# Patient Record
Sex: Female | Born: 1985 | Race: White | Hispanic: No | Marital: Married | Smoking: Current every day smoker
Health system: Southern US, Community
[De-identification: ages and names within clinical notes are randomized; demographics above are authoritative.]

---

## 2009-03-27 ENCOUNTER — Emergency Department (HOSPITAL_COMMUNITY): Admission: EM | Admit: 2009-03-27 | Discharge: 2009-03-28 | Payer: Self-pay | Admitting: Emergency Medicine

## 2009-11-16 IMAGING — CR DG CHEST 2V
2 series · 2 of 2 positions shown · non-contrast
Comparison: None

CLINICAL DATA: Shortness of breath, flank pain

CHEST - 2 VIEW

[w chest pa]
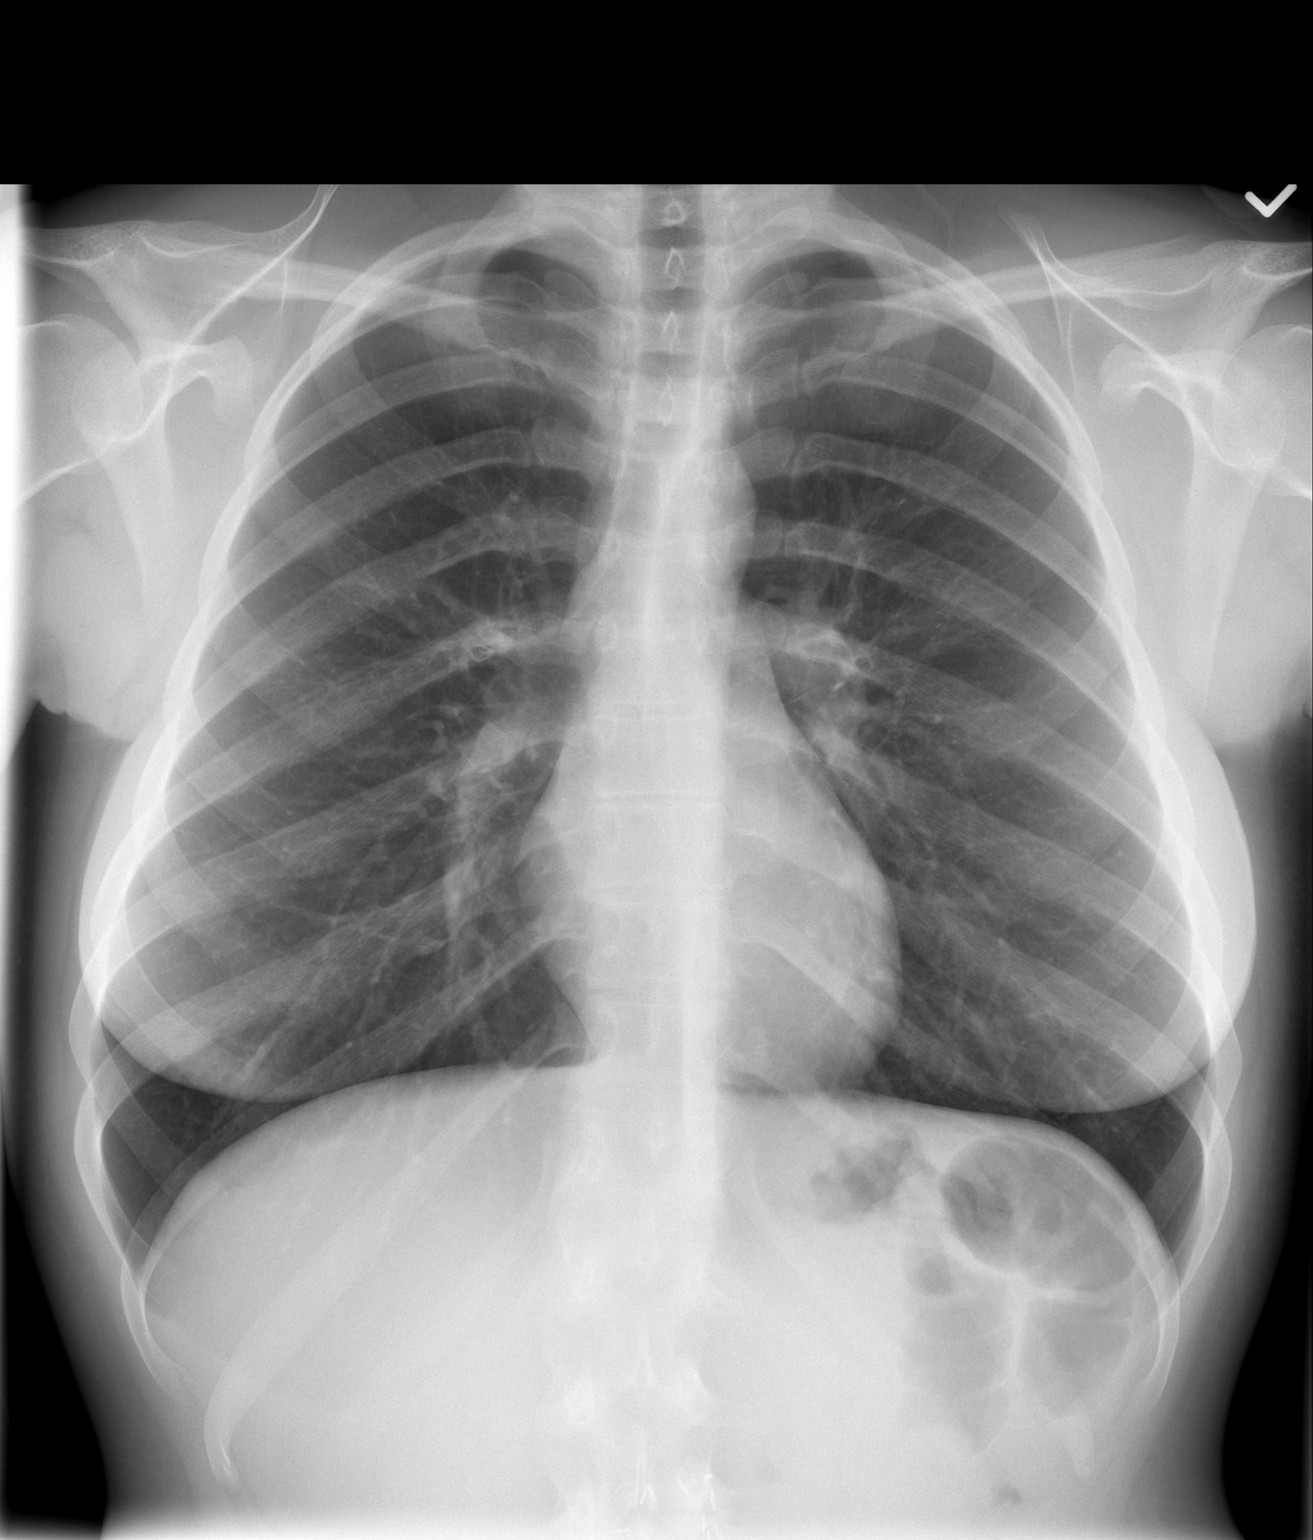

[w chest lat]
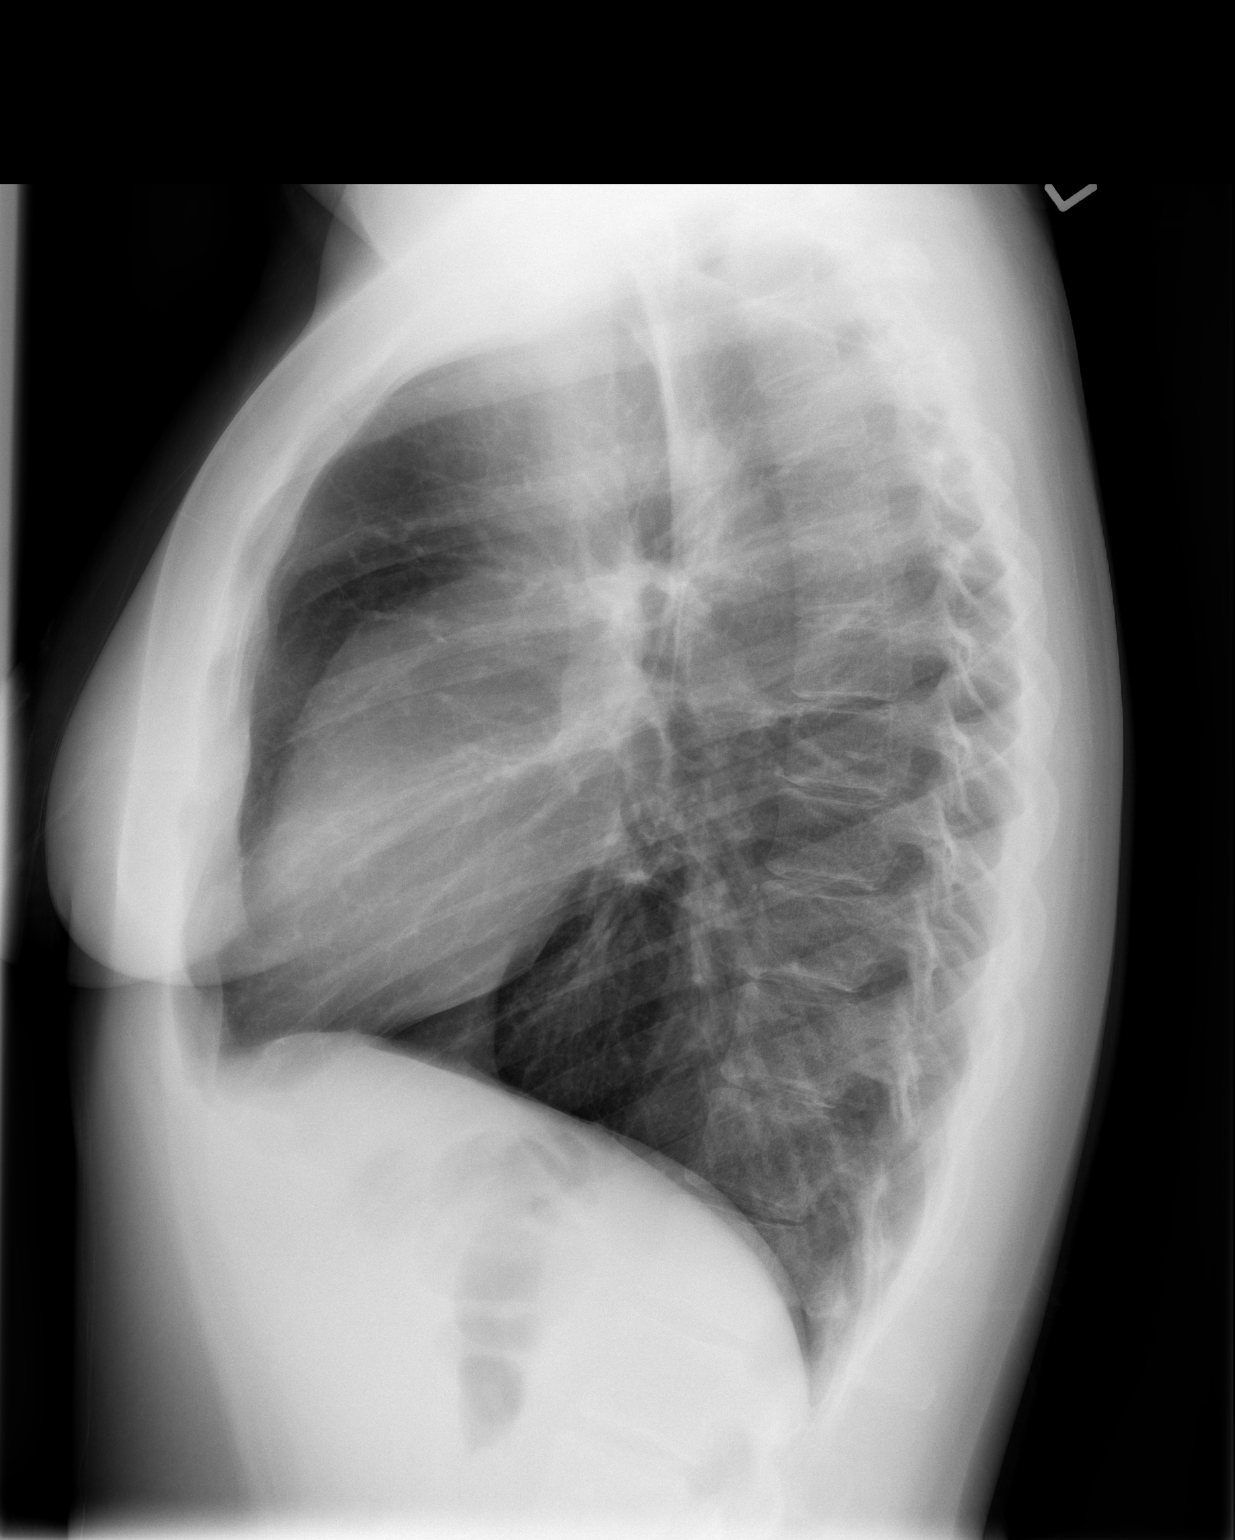

[2 of 2 positions shown; findings below may reference images not displayed]

FINDINGS: Heart and mediastinal contours are within normal limits.
No focal opacities or effusions.  No acute bony abnormality.
IMPRESSION: No active disease.

## 2011-04-05 LAB — URINALYSIS, ROUTINE W REFLEX MICROSCOPIC
Bilirubin Urine: NEGATIVE
Glucose, UA: NEGATIVE mg/dL
Hgb urine dipstick: NEGATIVE
Protein, ur: NEGATIVE mg/dL

## 2011-04-05 LAB — POCT PREGNANCY, URINE: Preg Test, Ur: NEGATIVE

## 2011-04-05 LAB — CBC
Hemoglobin: 12.9 g/dL (ref 12.0–15.0)
Platelets: 199 10*3/uL (ref 150–400)
RDW: 13.7 % (ref 11.5–15.5)
WBC: 7.2 10*3/uL (ref 4.0–10.5)

## 2011-04-05 LAB — HEPATIC FUNCTION PANEL
ALT: 15 U/L (ref 0–35)
AST: 16 U/L (ref 0–37)
Albumin: 3.8 g/dL (ref 3.5–5.2)
Total Protein: 6.5 g/dL (ref 6.0–8.3)

## 2011-04-05 LAB — D-DIMER, QUANTITATIVE: D-Dimer, Quant: 0.26 ug/mL-FEU (ref 0.00–0.48)

## 2011-04-05 LAB — POCT I-STAT, CHEM 8
BUN: 21 mg/dL (ref 6–23)
Chloride: 110 mEq/L (ref 96–112)
Sodium: 141 mEq/L (ref 135–145)

## 2011-04-05 LAB — DIFFERENTIAL
Basophils Absolute: 0 10*3/uL (ref 0.0–0.1)
Lymphocytes Relative: 23 % (ref 12–46)
Monocytes Absolute: 0.7 10*3/uL (ref 0.1–1.0)
Neutro Abs: 4.8 10*3/uL (ref 1.7–7.7)

## 2011-04-05 LAB — URINE MICROSCOPIC-ADD ON

## 2019-06-30 ENCOUNTER — Ambulatory Visit: Payer: Self-pay | Admitting: Pediatrics

## 2019-07-03 ENCOUNTER — Ambulatory Visit: Payer: Self-pay | Admitting: Allergy and Immunology

## 2022-08-30 ENCOUNTER — Telehealth: Payer: Medicaid Other | Admitting: Physician Assistant

## 2022-08-30 DIAGNOSIS — J019 Acute sinusitis, unspecified: Secondary | ICD-10-CM

## 2022-08-30 DIAGNOSIS — J208 Acute bronchitis due to other specified organisms: Secondary | ICD-10-CM | POA: Diagnosis not present

## 2022-08-30 DIAGNOSIS — B9689 Other specified bacterial agents as the cause of diseases classified elsewhere: Secondary | ICD-10-CM

## 2022-08-30 MED ORDER — AZITHROMYCIN 250 MG PO TABS: ORAL_TABLET | ORAL | 0 refills | Status: AC

## 2022-08-30 MED ORDER — ALBUTEROL SULFATE HFA 108 (90 BASE) MCG/ACT IN AERS: 2.0000 | INHALATION_SPRAY | Freq: Four times a day (QID) | RESPIRATORY_TRACT | 0 refills | Status: DC | PRN

## 2022-08-30 MED ORDER — PREDNISONE 20 MG PO TABS: 40.0000 mg | ORAL_TABLET | Freq: Every day | ORAL | 0 refills | Status: DC

## 2022-08-30 MED ORDER — BENZONATATE 100 MG PO CAPS: 100.0000 mg | ORAL_CAPSULE | Freq: Three times a day (TID) | ORAL | 0 refills | Status: DC | PRN

## 2022-08-30 NOTE — Progress Notes (Signed)
Virtual Visit Consent   Kari Barajas, you are scheduled for a virtual visit with a [facility] provider today. Just as with appointments in the office, your consent must be obtained to participate. Your consent will be active for this visit and any virtual visit you may have with one of our providers in the next 365 days. If you have a MyChart account, a copy of this consent can be sent to you electronically.  As this is a virtual visit, video technology does not allow for your provider to perform a traditional examination. This may limit your provider's ability to fully assess your condition. If your provider identifies any concerns that need to be evaluated in person or the need to arrange testing (such as labs, EKG, etc.), we will make arrangements to do so. Although advances in technology are sophisticated, we cannot ensure that it will always work on either your end or our end. If the connection with a video visit is poor, the visit may have to be switched to a telephone visit. With either a video or telephone visit, we are not always able to ensure that we have a secure connection.  By engaging in this virtual visit, you consent to the provision of healthcare and authorize for your insurance to be billed (if applicable) for the services provided during this visit. Depending on your insurance coverage, you may receive a charge related to this service.  I need to obtain your verbal consent now. Are you willing to proceed with your visit today? Kari Barajas has provided verbal consent on 08/30/2022 for a virtual visit (video or telephone). Margaretann Loveless, PA-C  Date: 08/30/2022 10:23 AM  Virtual Visit via Video Note   I, Margaretann Loveless, connected with  Kari Barajas  ([id number], 05-12-1986) on 08/30/22 at 10:15 AM EDT by a video-enabled telemedicine application and verified that I am speaking with the correct person using two identifiers.  Location: Patient: Virtual Visit Location  Patient: Home Provider: Virtual Visit Location Provider: Home Office   I discussed the limitations of evaluation and management by telemedicine and the availability of in person appointments. The patient expressed understanding and agreed to proceed.    History of Present Illness: Kari Barajas is a 36 y.o. who identifies as a female who was assigned female at birth, and is being seen today for cough.  HPI: Cough This is a new problem. The current episode started 1 to 4 weeks ago. The problem has been gradually worsening. The problem occurs every few minutes. The cough is Productive of sputum and productive of purulent sputum. Associated symptoms include chest pain (congestion), ear congestion, headaches, nasal congestion, postnasal drip, rhinorrhea, a sore throat (mild), shortness of breath and wheezing (mild). Pertinent negatives include no chills, ear pain or fever. The symptoms are aggravated by lying down. She has tried a beta-agonist inhaler and OTC cough suppressant for the symptoms. The treatment provided no relief. Her past medical history is significant for bronchitis.      Problems: There are no problems to display for this patient.   Allergies: Not on File Medications:  Current Outpatient Medications:    albuterol (VENTOLIN HFA) 108 (90 Base) MCG/ACT inhaler, Inhale 2 puffs into the lungs every 6 (six) hours as needed for wheezing or shortness of breath., Disp: 18 g, Rfl: 0   azithromycin (ZITHROMAX) 250 MG tablet, Take 2 tablets on day 1, then 1 tablet daily on days 2 through 5, Disp: 6 tablet, Rfl: 0   benzonatate (  TESSALON) 100 MG capsule, Take 1 capsule (100 mg total) by mouth 3 (three) times daily as needed., Disp: 30 capsule, Rfl: 0   predniSONE (DELTASONE) 20 MG tablet, Take 2 tablets (40 mg total) by mouth daily with breakfast., Disp: 10 tablet, Rfl: 0  Observations/Objective: Patient is well-developed, well-nourished in no acute distress.  Resting comfortably at home.   Head is normocephalic, atraumatic.  No labored breathing.  Speech is clear and coherent with logical content.  Patient is alert and oriented at baseline.    Assessment and Plan: 1. Acute bacterial bronchitis - azithromycin (ZITHROMAX) 250 MG tablet; Take 2 tablets on day 1, then 1 tablet daily on days 2 through 5  Dispense: 6 tablet; Refill: 0 - albuterol (VENTOLIN HFA) 108 (90 Base) MCG/ACT inhaler; Inhale 2 puffs into the lungs every 6 (six) hours as needed for wheezing or shortness of breath.  Dispense: 18 g; Refill: 0 - benzonatate (TESSALON) 100 MG capsule; Take 1 capsule (100 mg total) by mouth 3 (three) times daily as needed.  Dispense: 30 capsule; Refill: 0  2. Acute bacterial sinusitis - azithromycin (ZITHROMAX) 250 MG tablet; Take 2 tablets on day 1, then 1 tablet daily on days 2 through 5  Dispense: 6 tablet; Refill: 0 - predniSONE (DELTASONE) 20 MG tablet; Take 2 tablets (40 mg total) by mouth daily with breakfast.  Dispense: 10 tablet; Refill: 0  - Worsening over a week despite OTC medications - Will treat with Z-pack, prednisone, albuterol, and tessalon perles - Can continue Mucinex  - Push fluids.  - Rest.  - Steam and humidifier can help - Seek in person evaluation if worsening or symptoms fail to improve    Follow Up Instructions: I discussed the assessment and treatment plan with the patient. The patient was provided an opportunity to ask questions and all were answered. The patient agreed with the plan and demonstrated an understanding of the instructions.  A copy of instructions were sent to the patient via MyChart unless otherwise noted below.    The patient was advised to call back or seek an in-person evaluation if the symptoms worsen or if the condition fails to improve as anticipated.  Time:  I spent 10 minutes with the patient via telehealth technology discussing the above problems/concerns.    Margaretann Loveless, PA-C

## 2022-08-30 NOTE — Patient Instructions (Signed)
Aviva Kluver, thank you for joining Margaretann Loveless, PA-C for today's virtual visit.  While this provider is not your primary care provider (PCP), if your PCP is located in our provider database this encounter information will be shared with them immediately following your visit.  Consent: (Patient) Kari Barajas provided verbal consent for this virtual visit at the beginning of the encounter.  Current Medications:  Current Outpatient Medications:    albuterol (VENTOLIN HFA) 108 (90 Base) MCG/ACT inhaler, Inhale 2 puffs into the lungs every 6 (six) hours as needed for wheezing or shortness of breath., Disp: 18 g, Rfl: 0   azithromycin (ZITHROMAX) 250 MG tablet, Take 2 tablets on day 1, then 1 tablet daily on days 2 through 5, Disp: 6 tablet, Rfl: 0   benzonatate (TESSALON) 100 MG capsule, Take 1 capsule (100 mg total) by mouth 3 (three) times daily as needed., Disp: 30 capsule, Rfl: 0   predniSONE (DELTASONE) 20 MG tablet, Take 2 tablets (40 mg total) by mouth daily with breakfast., Disp: 10 tablet, Rfl: 0   Medications ordered in this encounter:  Meds ordered this encounter  Medications   azithromycin (ZITHROMAX) 250 MG tablet    Sig: Take 2 tablets on day 1, then 1 tablet daily on days 2 through 5    Dispense:  6 tablet    Refill:  0    Order Specific Question:   Supervising Provider    Answer:   Hyacinth Meeker, BRIAN [[id number]]   albuterol (VENTOLIN HFA) 108 (90 Base) MCG/ACT inhaler    Sig: Inhale 2 puffs into the lungs every 6 (six) hours as needed for wheezing or shortness of breath.    Dispense:  18 g    Refill:  0    Order Specific Question:   Supervising Provider    Answer:   MILLER, BRIAN [[id number]]   predniSONE (DELTASONE) 20 MG tablet    Sig: Take 2 tablets (40 mg total) by mouth daily with breakfast.    Dispense:  10 tablet    Refill:  0    Order Specific Question:   Supervising Provider    Answer:   MILLER, BRIAN [[id number]]   benzonatate (TESSALON) 100 MG capsule    Sig: Take 1  capsule (100 mg total) by mouth 3 (three) times daily as needed.    Dispense:  30 capsule    Refill:  0    Order Specific Question:   Supervising Provider    Answer:   Hyacinth Meeker, BRIAN [[id number]]     *If you need refills on other medications prior to your next appointment, please contact your pharmacy*  Follow-Up: Call back or seek an in-person evaluation if the symptoms worsen or if the condition fails to improve as anticipated.  Other Instructions Acute Bronchitis, Adult  Acute bronchitis is when air tubes in the lungs (bronchi) suddenly get swollen. The condition can make it hard for you to breathe. In adults, acute bronchitis usually goes away within 2 weeks. A cough caused by bronchitis may last up to 3 weeks. Smoking, allergies, and asthma can make the condition worse. What are the causes? Germs that cause cold and flu (viruses). The most common cause of this condition is the virus that causes the common cold. Bacteria. Substances that bother (irritate) the lungs, including: Smoke from cigarettes and other types of tobacco. Dust and pollen. Fumes from chemicals, gases, or burned fuel. Indoor or outdoor air pollution. What increases the risk? A weak body's defense system. This is also  called the immune system. Any condition that affects your lungs and breathing, such as asthma. What are the signs or symptoms? A cough. Coughing up clear, yellow, or green mucus. Making high-pitched whistling sounds when you breathe, most often when you breathe out (wheezing). Runny or stuffy nose. Having too much mucus in your lungs (chest congestion). Shortness of breath. Body aches. A sore throat. How is this treated? Acute bronchitis may go away over time without treatment. Your doctor may tell you to: Drink more fluids. This will help thin your mucus so it is easier to cough up. Use a device that gets medicine into your lungs (inhaler). Use a vaporizer or a humidifier. These are machines that  add water to the air. This helps with coughing and poor breathing. Take a medicine that thins mucus and helps clear it from your lungs. Take a medicine that prevents or stops coughing. It is not common to take an antibiotic medicine for this condition. Follow these instructions at home:  Take over-the-counter and prescription medicines only as told by your doctor. Use an inhaler, vaporizer, or humidifier as told by your doctor. Take two teaspoons (10 mL) of honey at bedtime. This helps lessen your coughing at night. Drink enough fluid to keep your pee (urine) pale yellow. Do not smoke or use any products that contain nicotine or tobacco. If you need help quitting, ask your doctor. Get a lot of rest. Return to your normal activities when your doctor says that it is safe. Keep all follow-up visits. How is this prevented?  Wash your hands often with soap and water for at least 20 seconds. If you cannot use soap and water, use hand sanitizer. Avoid contact with people who have cold symptoms. Try not to touch your mouth, nose, or eyes with your hands. Avoid breathing in smoke or chemical fumes. Make sure to get the flu shot every year. Contact a doctor if: Your symptoms do not get better in 2 weeks. You have trouble coughing up the mucus. Your cough keeps you awake at night. You have a fever. Get help right away if: You cough up blood. You have chest pain. You have very bad shortness of breath. You faint or keep feeling like you are going to faint. You have a very bad headache. Your fever or chills get worse. These symptoms may be an emergency. Get help right away. Call your local emergency services (911 in the [place] Do not wait to see if the symptoms will go away. Do not drive yourself to the hospital. Summary Acute bronchitis is when air tubes in the lungs (bronchi) suddenly get swollen. In adults, acute bronchitis usually goes away within 2 weeks. Drink more fluids. This will  help thin your mucus so it is easier to cough up. Take over-the-counter and prescription medicines only as told by your doctor. Contact a doctor if your symptoms do not improve after 2 weeks of treatment. This information is not intended to replace advice given to you by your health care provider. Make sure you discuss any questions you have with your health care provider. Document Revised: 04/13/2021 Document Reviewed: 04/13/2021 Elsevier Patient Education  2023 Elsevier Inc.    If you have been instructed to have an in-person evaluation today at a local Urgent Care facility, please use the link below. It will take you to a list of all of our available [facility], including address, phone number and hours of operation. Please do not delay care.  [facility]  If you or a family member do not have a primary care provider, use the link below to schedule a visit and establish care. When you choose a [facility] primary care physician or advanced practice provider, you gain a long-term partner in health. Find a Primary Care Provider  Learn more about [facility]'s in-office and virtual care options: [facility]

## 2022-09-06 ENCOUNTER — Telehealth: Payer: Medicaid Other | Admitting: Physician Assistant

## 2022-09-06 DIAGNOSIS — H6983 Other specified disorders of Eustachian tube, bilateral: Secondary | ICD-10-CM

## 2022-09-06 MED ORDER — IPRATROPIUM BROMIDE 0.03 % NA SOLN: 2.0000 | Freq: Two times a day (BID) | NASAL | 0 refills | Status: DC

## 2022-09-06 MED ORDER — FLUTICASONE PROPIONATE 50 MCG/ACT NA SUSP: 2.0000 | Freq: Every day | NASAL | 0 refills | Status: DC

## 2022-09-06 NOTE — Progress Notes (Signed)
Virtual Visit Consent   Kari Barajas, you are scheduled for a virtual visit with a [facility] provider today. Just as with appointments in the office, your consent must be obtained to participate. Your consent will be active for this visit and any virtual visit you may have with one of our providers in the next 365 days. If you have a MyChart account, a copy of this consent can be sent to you electronically.  As this is a virtual visit, video technology does not allow for your provider to perform a traditional examination. This may limit your provider's ability to fully assess your condition. If your provider identifies any concerns that need to be evaluated in person or the need to arrange testing (such as labs, EKG, etc.), we will make arrangements to do so. Although advances in technology are sophisticated, we cannot ensure that it will always work on either your end or our end. If the connection with a video visit is poor, the visit may have to be switched to a telephone visit. With either a video or telephone visit, we are not always able to ensure that we have a secure connection.  By engaging in this virtual visit, you consent to the provision of healthcare and authorize for your insurance to be billed (if applicable) for the services provided during this visit. Depending on your insurance coverage, you may receive a charge related to this service.  I need to obtain your verbal consent now. Are you willing to proceed with your visit today? Kari Barajas has provided verbal consent on 09/06/2022 for a virtual visit (video or telephone). Margaretann Loveless, PA-C  Date: 09/06/2022 1:08 PM  Virtual Visit via Video Note   I, Margaretann Loveless, connected with  Kari Barajas  ([id number], [birth year]) on 09/06/22 at 12:45 PM EDT by a video-enabled telemedicine application and verified that I am speaking with the correct person using two identifiers.  Location: Patient: Virtual Visit Location  Patient: Home Provider: Virtual Visit Location Provider: Home Office   I discussed the limitations of evaluation and management by telemedicine and the availability of in person appointments. The patient expressed understanding and agreed to proceed.    History of Present Illness: Kari Barajas is a 36 y.o. who identifies as a female who was assigned female at birth, and is being seen today for continued head congestion symptoms . Was seen virtually on 08/30/22 for bacterial bronchitis and sinusitis. Started on Zpack, prednisone 40mg  x 5 days and albuterol. Reports chest symptoms have improved some, but still having head congestion, aural fullness, feeling off balance ("like on a boat or on the moon"). Some purulent discharge still present.    Problems: There are no problems to display for this patient.   Allergies: Not on File Medications:  Current Outpatient Medications:    fluticasone (FLONASE) 50 MCG/ACT nasal spray, Place 2 sprays into both nostrils daily., Disp: 16 g, Rfl: 0   ipratropium (ATROVENT) 0.03 % nasal spray, Place 2 sprays into both nostrils every 12 (twelve) hours., Disp: 30 mL, Rfl: 0   albuterol (VENTOLIN HFA) 108 (90 Base) MCG/ACT inhaler, Inhale 2 puffs into the lungs every 6 (six) hours as needed for wheezing or shortness of breath., Disp: 18 g, Rfl: 0   benzonatate (TESSALON) 100 MG capsule, Take 1 capsule (100 mg total) by mouth 3 (three) times daily as needed., Disp: 30 capsule, Rfl: 0   predniSONE (DELTASONE) 20 MG tablet, Take 2 tablets (40 mg total) by mouth daily  with breakfast., Disp: 10 tablet, Rfl: 0  Observations/Objective: Patient is well-developed, well-nourished in no acute distress.  Resting comfortably at home.  Head is normocephalic, atraumatic.  No labored breathing.  Speech is clear and coherent with logical content.  Patient is alert and oriented at baseline.    Assessment and Plan: 1. ETD (Eustachian tube dysfunction), bilateral - ipratropium  (ATROVENT) 0.03 % nasal spray; Place 2 sprays into both nostrils every 12 (twelve) hours.  Dispense: 30 mL; Refill: 0 - fluticasone (FLONASE) 50 MCG/ACT nasal spray; Place 2 sprays into both nostrils daily.  Dispense: 16 g; Refill: 0  - Suspect ETD from inflammation - Add Fluticasone and Ipratropium bromide for congestion, inflammation and drainage - May use tylenol for pain - Advised she would need further evaluation in person if symptoms continue to worsen or fail to improve  Follow Up Instructions: I discussed the assessment and treatment plan with the patient. The patient was provided an opportunity to ask questions and all were answered. The patient agreed with the plan and demonstrated an understanding of the instructions.  A copy of instructions were sent to the patient via MyChart unless otherwise noted below.    The patient was advised to call back or seek an in-person evaluation if the symptoms worsen or if the condition fails to improve as anticipated.  Time:  I spent 12 minutes with the patient via telehealth technology discussing the above problems/concerns.    Margaretann Loveless, PA-C

## 2022-09-06 NOTE — Patient Instructions (Signed)
Aviva Kluver, thank you for joining Margaretann Loveless, PA-C for today's virtual visit.  While this provider is not your primary care provider (PCP), if your PCP is located in our provider database this encounter information will be shared with them immediately following your visit.  Consent: (Patient) Kari Barajas provided verbal consent for this virtual visit at the beginning of the encounter.  Current Medications:  Current Outpatient Medications:    fluticasone (FLONASE) 50 MCG/ACT nasal spray, Place 2 sprays into both nostrils daily., Disp: 16 g, Rfl: 0   ipratropium (ATROVENT) 0.03 % nasal spray, Place 2 sprays into both nostrils every 12 (twelve) hours., Disp: 30 mL, Rfl: 0   albuterol (VENTOLIN HFA) 108 (90 Base) MCG/ACT inhaler, Inhale 2 puffs into the lungs every 6 (six) hours as needed for wheezing or shortness of breath., Disp: 18 g, Rfl: 0   benzonatate (TESSALON) 100 MG capsule, Take 1 capsule (100 mg total) by mouth 3 (three) times daily as needed., Disp: 30 capsule, Rfl: 0   predniSONE (DELTASONE) 20 MG tablet, Take 2 tablets (40 mg total) by mouth daily with breakfast., Disp: 10 tablet, Rfl: 0   Medications ordered in this encounter:  Meds ordered this encounter  Medications   ipratropium (ATROVENT) 0.03 % nasal spray    Sig: Place 2 sprays into both nostrils every 12 (twelve) hours.    Dispense:  30 mL    Refill:  0    Order Specific Question:   Supervising Provider    Answer:   Merrilee Jansky [[id number]]   fluticasone (FLONASE) 50 MCG/ACT nasal spray    Sig: Place 2 sprays into both nostrils daily.    Dispense:  16 g    Refill:  0    Order Specific Question:   Supervising Provider    Answer:   Merrilee Jansky [id number]     *If you need refills on other medications prior to your next appointment, please contact your pharmacy*  Follow-Up: Call back or seek an in-person evaluation if the symptoms worsen or if the condition fails to improve as  anticipated.  Other Instructions Eustachian Tube Dysfunction  Eustachian tube dysfunction refers to a condition in which a blockage develops in the narrow passage that connects the middle ear to the back of the nose (eustachian tube). The eustachian tube regulates air pressure in the middle ear by letting air move between the ear and nose. It also helps to drain fluid from the middle ear space. Eustachian tube dysfunction can affect one or both ears. When the eustachian tube does not function properly, air pressure, fluid, or both can build up in the middle ear. What are the causes? This condition occurs when the eustachian tube becomes blocked or cannot open normally. Common causes of this condition include: Ear infections. Colds and other infections that affect the nose, mouth, and throat (upper respiratory tract). Allergies. Irritation from cigarette smoke. Irritation from stomach acid coming up into the esophagus (gastroesophageal reflux). The esophagus is the part of the body that moves food from the mouth to the stomach. Sudden changes in air pressure, such as from descending in an airplane or scuba diving. Abnormal growths in the nose or throat, such as: Growths that line the nose (nasal polyps). Abnormal growth of cells (tumors). Enlarged tissue at the back of the throat (adenoids). What increases the risk? You are more likely to develop this condition if: You smoke. You are overweight. You are a child who has: Certain birth defects  of the mouth, such as cleft palate. Large tonsils or adenoids. What are the signs or symptoms? Common symptoms of this condition include: A feeling of fullness in the ear. Ear pain. Clicking or popping noises in the ear. Ringing in the ear (tinnitus). Hearing loss. Loss of balance. Dizziness. Symptoms may get worse when the air pressure around you changes, such as when you travel to an area of high elevation, fly on an airplane, or go scuba  diving. How is this diagnosed? This condition may be diagnosed based on: Your symptoms. A physical exam of your ears, nose, and throat. Tests, such as those that measure: The movement of your eardrum. Your hearing (audiometry). How is this treated? Treatment depends on the cause and severity of your condition. In mild cases, you may relieve your symptoms by moving air into your ears. This is called "popping the ears." In more severe cases, or if you have symptoms of fluid in your ears, treatment may include: Medicines to relieve congestion (decongestants). Medicines that treat allergies (antihistamines). Nasal sprays or ear drops that contain medicines that reduce swelling (steroids). A procedure to drain the fluid in your eardrum. In this procedure, a small tube may be placed in the eardrum to: Drain the fluid. Restore the air in the middle ear space. A procedure to insert a balloon device through the nose to inflate the opening of the eustachian tube (balloon dilation). Follow these instructions at home: Lifestyle Do not do any of the following until your health care provider approves: Travel to high altitudes. Fly in airplanes. Work in a Estate agent or room. Scuba dive. Do not use any products that contain nicotine or tobacco. These products include cigarettes, chewing tobacco, and vaping devices, such as e-cigarettes. If you need help quitting, ask your health care provider. Keep your ears dry. Wear fitted earplugs during showering and bathing. Dry your ears completely after. General instructions Take over-the-counter and prescription medicines only as told by your health care provider. Use techniques to help pop your ears as recommended by your health care provider. These may include: Chewing gum. Yawning. Frequent, forceful swallowing. Closing your mouth, holding your nose closed, and gently blowing as if you are trying to blow air out of your nose. Keep all follow-up  visits. This is important. Contact a health care provider if: Your symptoms do not go away after treatment. Your symptoms come back after treatment. You are unable to pop your ears. You have: A fever. Pain in your ear. Pain in your head or neck. Fluid draining from your ear. Your hearing suddenly changes. You become very dizzy. You lose your balance. Get help right away if: You have a sudden, severe increase in any of your symptoms. Summary Eustachian tube dysfunction refers to a condition in which a blockage develops in the eustachian tube. It can be caused by ear infections, allergies, inhaled irritants, or abnormal growths in the nose or throat. Symptoms may include ear pain or fullness, hearing loss, or ringing in the ears. Mild cases are treated with techniques to unblock the ears, such as yawning or chewing gum. More severe cases are treated with medicines or procedures. This information is not intended to replace advice given to you by your health care provider. Make sure you discuss any questions you have with your health care provider. Document Revised: 02/21/2021 Document Reviewed: 02/21/2021 Elsevier Patient Education  2023 [employer].    If you have been instructed to have an in-person evaluation today at a  local Urgent Care facility, please use the link below. It will take you to a list of all of our available [facility], including address, phone number and hours of operation. Please do not delay care.  [facility]  If you or a family member do not have a primary care provider, use the link below to schedule a visit and establish care. When you choose a [facility] primary care physician or advanced practice provider, you gain a long-term partner in health. Find a Primary Care Provider  Learn more about [facility] in-office and virtual care options:  - Get Care Now

## 2023-02-02 ENCOUNTER — Telehealth: Payer: Medicaid Other

## 2023-10-03 ENCOUNTER — Encounter (HOSPITAL_COMMUNITY): Payer: Self-pay

## 2023-10-03 ENCOUNTER — Ambulatory Visit (HOSPITAL_COMMUNITY)
Admission: EM | Admit: 2023-10-03 | Discharge: 2023-10-03 | Disposition: A | Discharge status: Home / Self Care | Payer: Medicaid Other | Attending: Internal Medicine | Admitting: Internal Medicine

## 2023-10-03 ENCOUNTER — Ambulatory Visit (HOSPITAL_COMMUNITY): Payer: Self-pay

## 2023-10-03 DIAGNOSIS — N3000 Acute cystitis without hematuria: Secondary | ICD-10-CM | POA: Diagnosis not present

## 2023-10-03 LAB — POCT URINALYSIS DIP (MANUAL ENTRY)
Bilirubin, UA: NEGATIVE
Glucose, UA: NEGATIVE mg/dL
Ketones, POC UA: NEGATIVE mg/dL
Nitrite, UA: NEGATIVE
Spec Grav, UA: 1.02 (ref 1.010–1.025)
Urobilinogen, UA: 0.2 U/dL
pH, UA: 6.5 (ref 5.0–8.0)

## 2023-10-03 LAB — POCT URINE PREGNANCY: Preg Test, Ur: NEGATIVE

## 2023-10-03 MED ORDER — PHENAZOPYRIDINE HCL 95 MG PO TABS: 95.0000 mg | ORAL_TABLET | Freq: Three times a day (TID) | ORAL | 0 refills | Status: AC | PRN

## 2023-10-03 MED ORDER — NITROFURANTOIN MONOHYD MACRO 100 MG PO CAPS: 100.0000 mg | ORAL_CAPSULE | Freq: Two times a day (BID) | ORAL | 0 refills | Status: AC

## 2023-10-03 NOTE — ED Provider Notes (Addendum)
[facility]    CSN: [id number] Arrival date & time: 10/03/23  1257      History   Chief Complaint Chief Complaint  Patient presents with   Abdominal Pain    HPI Kari Barajas is a 37 y.o. female.   Patient presents to clinic for abdominal pain after voiding and cloudy urination for the past week.  She has been having intermittent flank pain.  Denies any hematuria, urinary frequency or urgency.  Denies any changes to discharge or odor.  She has been taking over-the-counter AZO without much improvement.  Overall she feels full body discomfort, memory fog and fatigue.  No fevers.  Some nausea, no vomiting.  Reports anaphylaxis to Augmentin and PCN.   The history is provided by the patient and medical records.  Abdominal Pain Associated symptoms: dysuria and nausea   Associated symptoms: no chills, no fever, no hematuria, no vaginal discharge and no vomiting     History reviewed. No pertinent past medical history.  There are no problems to display for this patient.   History reviewed. No pertinent surgical history.  OB History   No obstetric history on file.      Home Medications    Prior to Admission medications   Medication Sig Start Date End Date Taking? Authorizing Provider  nitrofurantoin, macrocrystal-monohydrate, (MACROBID) 100 MG capsule Take 1 capsule (100 mg total) by mouth 2 (two) times daily. 10/03/23  Yes Rinaldo Ratel, [place] N, FNP  phenazopyridine (PYRIDIUM) 95 MG tablet Take 1 tablet (95 mg total) by mouth 3 (three) times daily as needed for pain. 10/03/23  Yes Sava Proby, [place] N, FNP    Family History History reviewed. No pertinent family history.  Social History Social History   Tobacco Use   Smoking status: Every Day    Current packs/day: 0.50    Types: Cigarettes   Smokeless tobacco: Never  Vaping Use   Vaping status: Never Used     Allergies   Augmentin [amoxicillin-pot clavulanate], Penicillins, Ceclor [cefaclor], Levaquin  [levofloxacin], and Sulfa antibiotics   Review of Systems Review of Systems  Constitutional:  Negative for chills and fever.  Gastrointestinal:  Positive for abdominal pain and nausea. Negative for vomiting.  Genitourinary:  Positive for dysuria and flank pain. Negative for decreased urine volume, difficulty urinating, frequency, hematuria, menstrual problem and vaginal discharge.  Musculoskeletal:  Positive for back pain.     Physical Exam Triage Vital Signs ED Triage Vitals  Encounter Vitals Group     BP 10/03/23 1333 126/80     Systolic BP Percentile --      Diastolic BP Percentile --      Pulse Rate 10/03/23 1333 (!) 58     Resp 10/03/23 1333 16     Temp 10/03/23 1333 97.9 F (36.6 C)     Temp Source 10/03/23 1333 Oral     SpO2 10/03/23 1333 98 %     Weight --      Height --      Head Circumference --      Peak Flow --      Pain Score 10/03/23 1334 9     Pain Loc --      Pain Education --      Exclude from Growth Chart --    No data found.  Updated Vital Signs BP 126/80 (BP Location: Left Arm)   Pulse (!) 58   Temp 97.9 F (36.6 C) (Oral)   Resp 16   SpO2 98%   Visual  Acuity Right Eye Distance:   Left Eye Distance:   Bilateral Distance:    Right Eye Near:   Left Eye Near:    Bilateral Near:     Physical Exam Vitals and nursing note reviewed.  Constitutional:      Appearance: She is well-developed.  HENT:     Head: Normocephalic and atraumatic.     Mouth/Throat:     Mouth: Mucous membranes are moist.  Eyes:     General: No scleral icterus. Cardiovascular:     Rate and Rhythm: Normal rate and regular rhythm.     Heart sounds: Normal heart sounds. No murmur heard. Pulmonary:     Effort: Pulmonary effort is normal. No respiratory distress.     Breath sounds: Normal breath sounds.  Abdominal:     General: Abdomen is flat. Bowel sounds are normal.     Palpations: Abdomen is soft.     Tenderness: There is abdominal tenderness in the suprapubic  area. There is right CVA tenderness. There is no left CVA tenderness.  Skin:    General: Skin is warm and dry.  Neurological:     General: No focal deficit present.     Mental Status: She is alert and oriented to person, place, and time.  Psychiatric:        Mood and Affect: Mood is anxious.      UC Treatments / Results  Labs (all labs ordered are listed, but only abnormal results are displayed) Labs Reviewed  POCT URINALYSIS DIP (MANUAL ENTRY) - Abnormal; Notable for the following components:      Result Value   Clarity, UA cloudy (*)    Blood, UA moderate (*)    Protein Ur, POC trace (*)    Leukocytes, UA Small (1+) (*)    All other components within normal limits  URINE CULTURE  POCT URINE PREGNANCY    EKG   Radiology No results found.  Procedures Procedures (including critical care time)  Medications Ordered in UC Medications - No data to display  Initial Impression / Assessment and Plan / UC Course  I have reviewed the triage vital signs and the nursing notes.  Pertinent labs & imaging results that were available during my care of the patient were reviewed by me and considered in my medical decision making (see chart for details).  Vitals and triage reviewed, patient is hemodynamically stable.  Reports mild right-sided CVA tenderness, without fever, emesis or tachycardia, low concern for pyelonephritis at this time.  Urinalysis shows moderate red blood cells and small leukocytes.  Urine pregnancy negative.  Patient reports anaphylaxis to penicillin, Augmentin and severe allergies to Levaquin, doxycycline and sulfa drugs.  Patient placed on Macrobid and urine sent for culture, staff will notify if we need to modify antibiotic therapy.  Pyridium as needed for dysuria.  Plan of care, follow-up care return precautions given, no questions at this time.     Final Clinical Impressions(s) / UC Diagnoses   Final diagnoses:  Acute cystitis without hematuria      Discharge Instructions      Take all antibiotics as prescribed and until finished, you can take them with food to prevent gastrointestinal upset.  Our staff will contact you if we need to modify treatment based on your culture.  You can take the Pyridium 3 times daily as needed for pain with urination, this will change the color of your secretions.  Return to clinic or follow-up with your primary care provider if you have  no improvement despite antibiotics, or any new or urgent symptoms.     ED Prescriptions     Medication Sig Dispense Auth. Provider   phenazopyridine (PYRIDIUM) 95 MG tablet Take 1 tablet (95 mg total) by mouth 3 (three) times daily as needed for pain. 10 tablet Rinaldo Ratel, [place] N, FNP   nitrofurantoin, macrocrystal-monohydrate, (MACROBID) 100 MG capsule Take 1 capsule (100 mg total) by mouth 2 (two) times daily. 10 capsule Lawanda Holzheimer, [place] N, FNP      PDMP not reviewed this encounter.       Lovada Barwick, [place] N, [place] 10/03/23 1416

## 2023-10-03 NOTE — ED Triage Notes (Signed)
Pt with c/o abdominal pain after voiding. States she took OTC medications for UTI and thought it was helping but now the pain is worse.

## 2023-10-03 NOTE — Discharge Instructions (Addendum)
Take all antibiotics as prescribed and until finished, you can take them with food to prevent gastrointestinal upset.  Our staff will contact you if we need to modify treatment based on your culture.  You can take the Pyridium 3 times daily as needed for pain with urination, this will change the color of your secretions.  Return to clinic or follow-up with your primary care provider if you have no improvement despite antibiotics, or any new or urgent symptoms.

## 2023-10-05 LAB — URINE CULTURE: Culture: >=100000 — AB
